# Patient Record
Sex: Male | Born: 2005 | Race: Black or African American | Hispanic: No | Marital: Single | Smoking: Never smoker
Health system: Southern US, Community
[De-identification: ages and names within clinical notes are randomized; demographics above are authoritative.]

## PROBLEM LIST (undated history)

## (undated) DIAGNOSIS — R079 Chest pain, unspecified: Secondary | ICD-10-CM

## (undated) DIAGNOSIS — I4719 Other supraventricular tachycardia: Secondary | ICD-10-CM

## (undated) DIAGNOSIS — I471 Supraventricular tachycardia: Secondary | ICD-10-CM

## (undated) DIAGNOSIS — F909 Attention-deficit hyperactivity disorder, unspecified type: Secondary | ICD-10-CM

## (undated) DIAGNOSIS — R011 Cardiac murmur, unspecified: Secondary | ICD-10-CM

## (undated) HISTORY — PX: CARDIAC SURGERY: SHX584

---
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
## 2023-03-28 IMAGING — CR DG SHOULDER 2+V*R*
2 series · 2 of 2 positions shown · non-contrast
Comparison: None.

CLINICAL DATA: Shoulder pain

EXAM:
RIGHT SHOULDER - 2+ VIEW

[w shoulder grashey right]
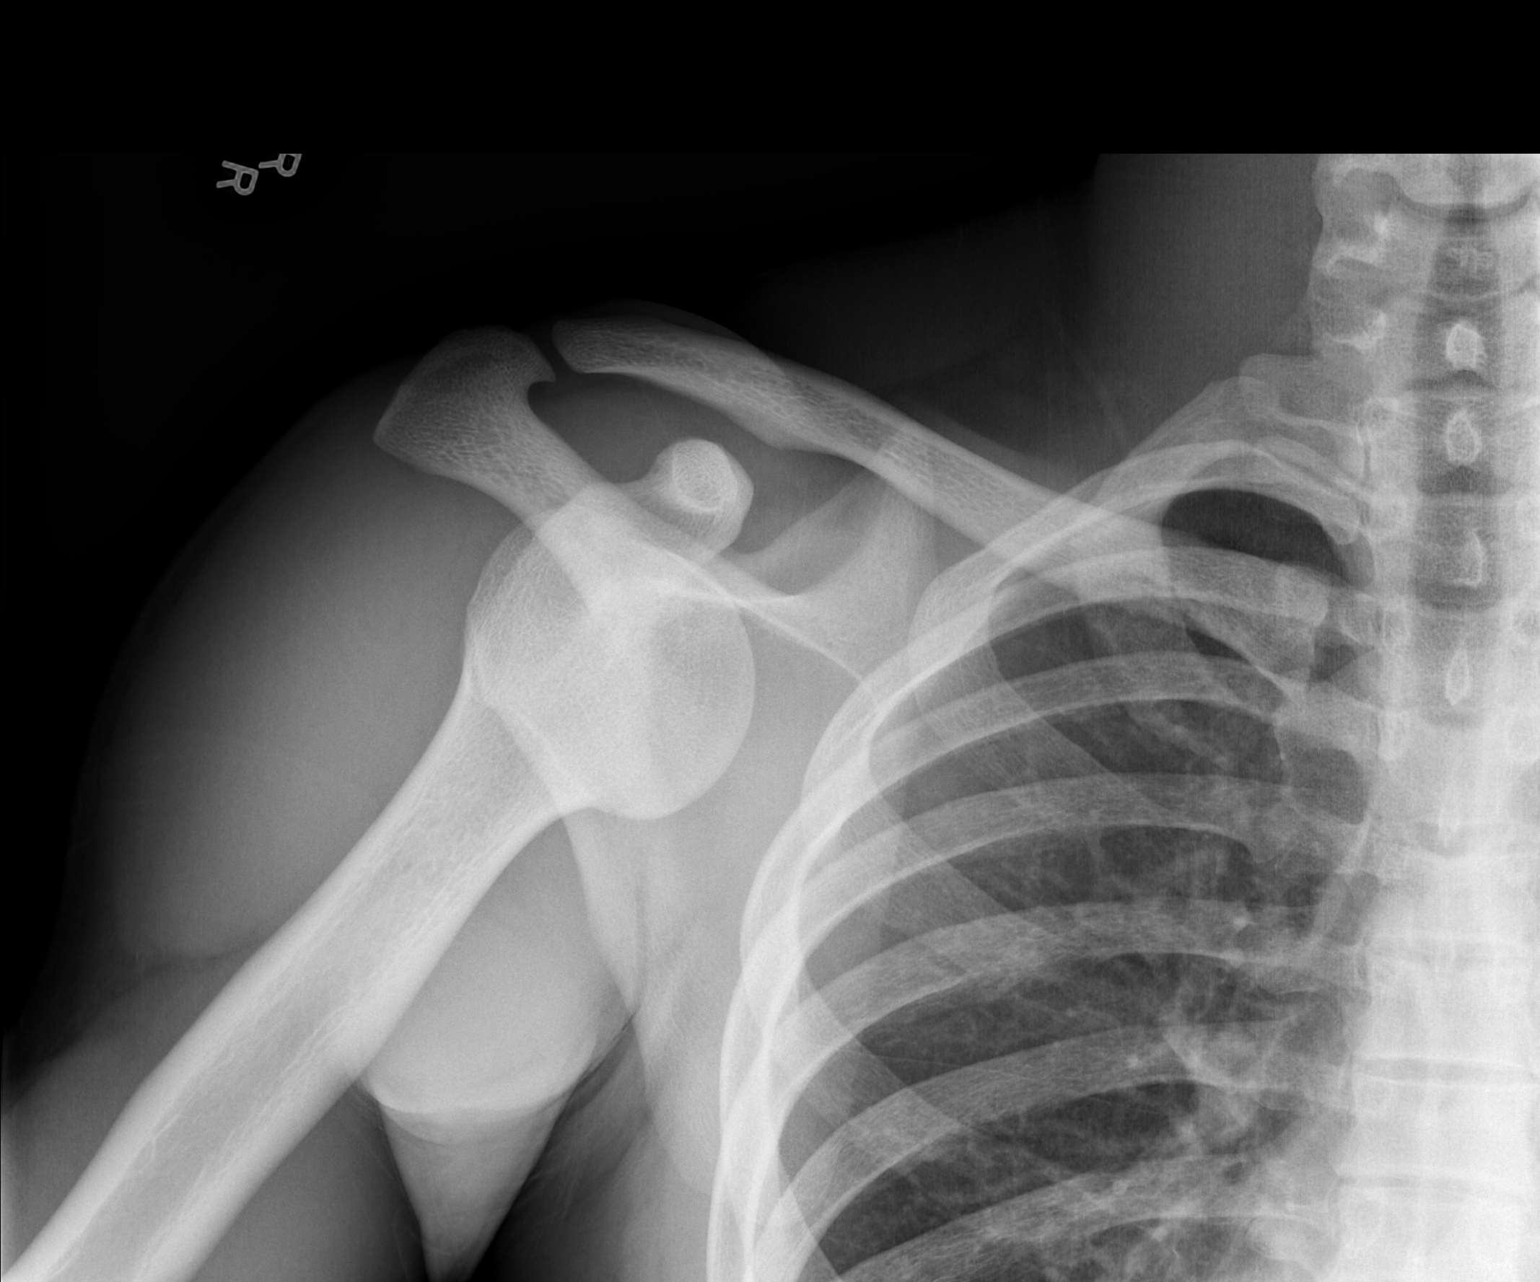

[w shoulder y view right]
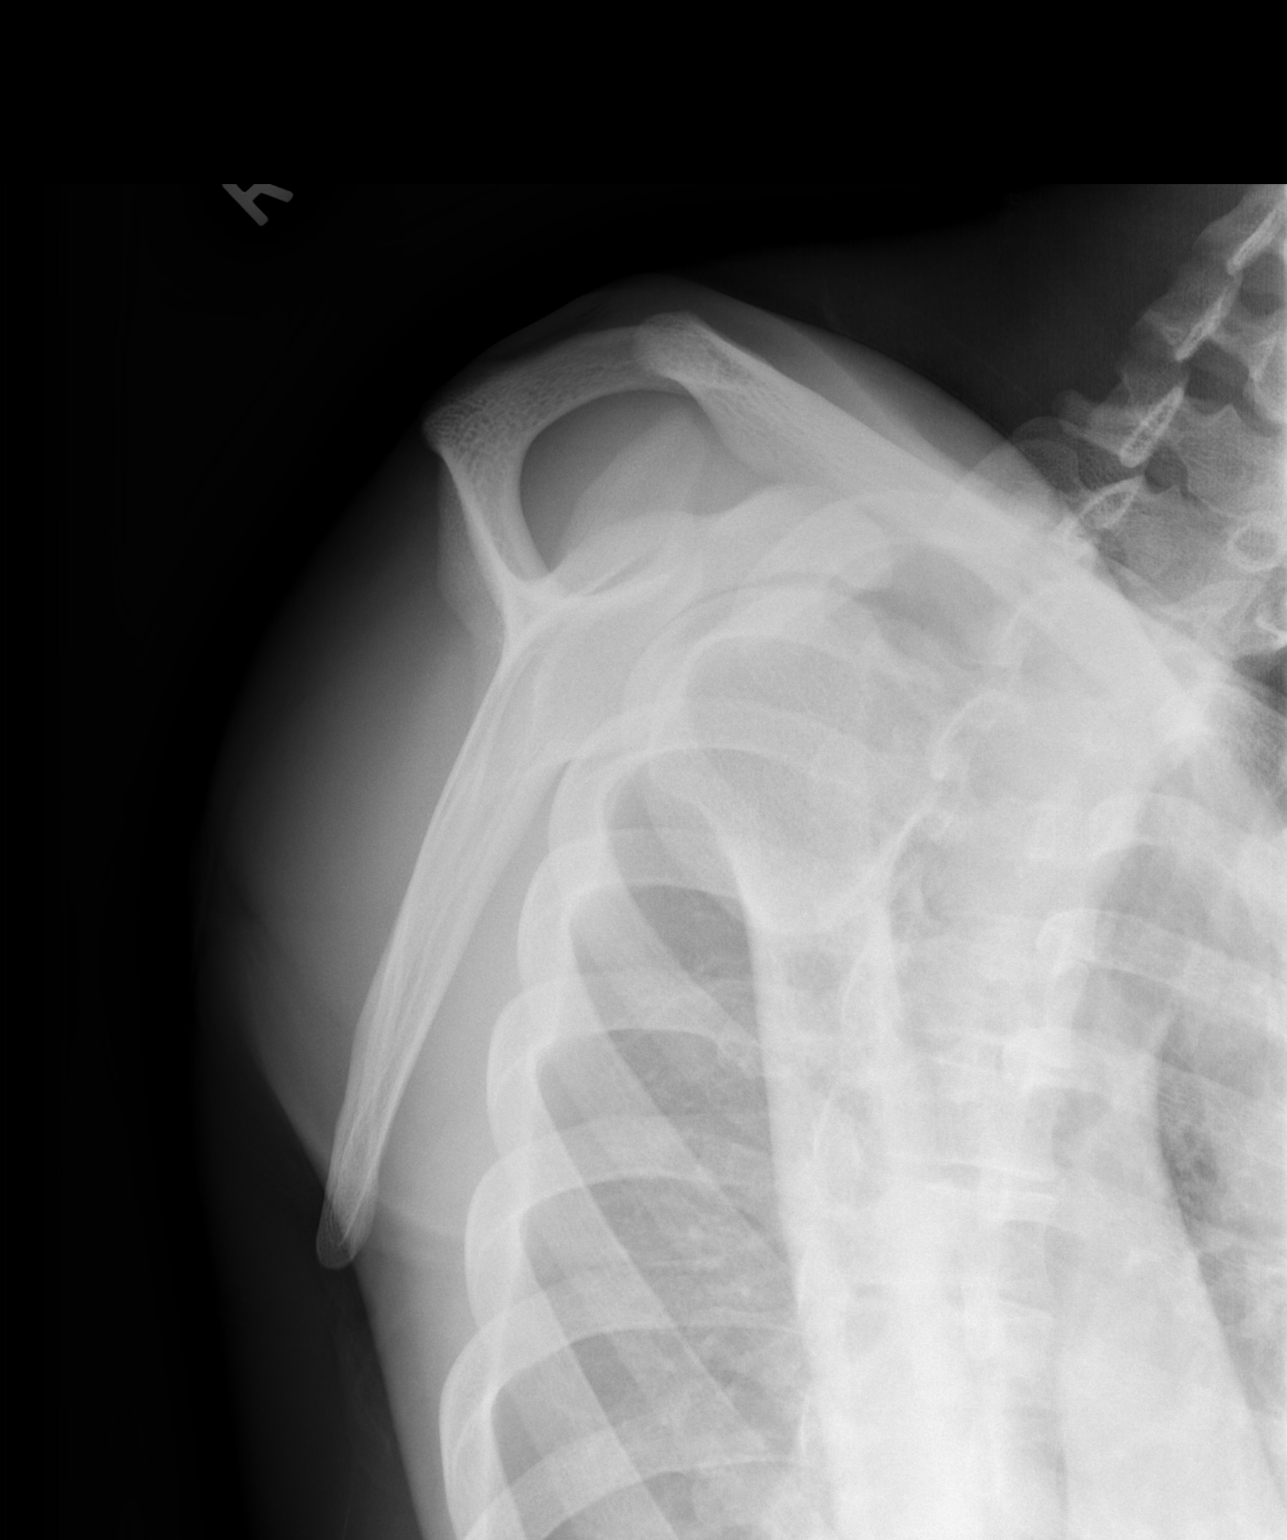

[2 of 2 positions shown; findings below may reference images not displayed]

FINDINGS: Right anterior shoulder dislocation. No evidence of fracture. Soft
tissues are unremarkable.
IMPRESSION: Right anterior shoulder dislocation.  No evidence of fracture.

## 2023-03-28 IMAGING — DX DG SHOULDER 2+V PORT*R*
2 series · 2 of 2 positions shown · non-contrast
Comparison: Radiographs of same day.

CLINICAL DATA: Status post reduction of right shoulder dislocation.

EXAM:
PORTABLE RIGHT SHOULDER

[shoulder ap]
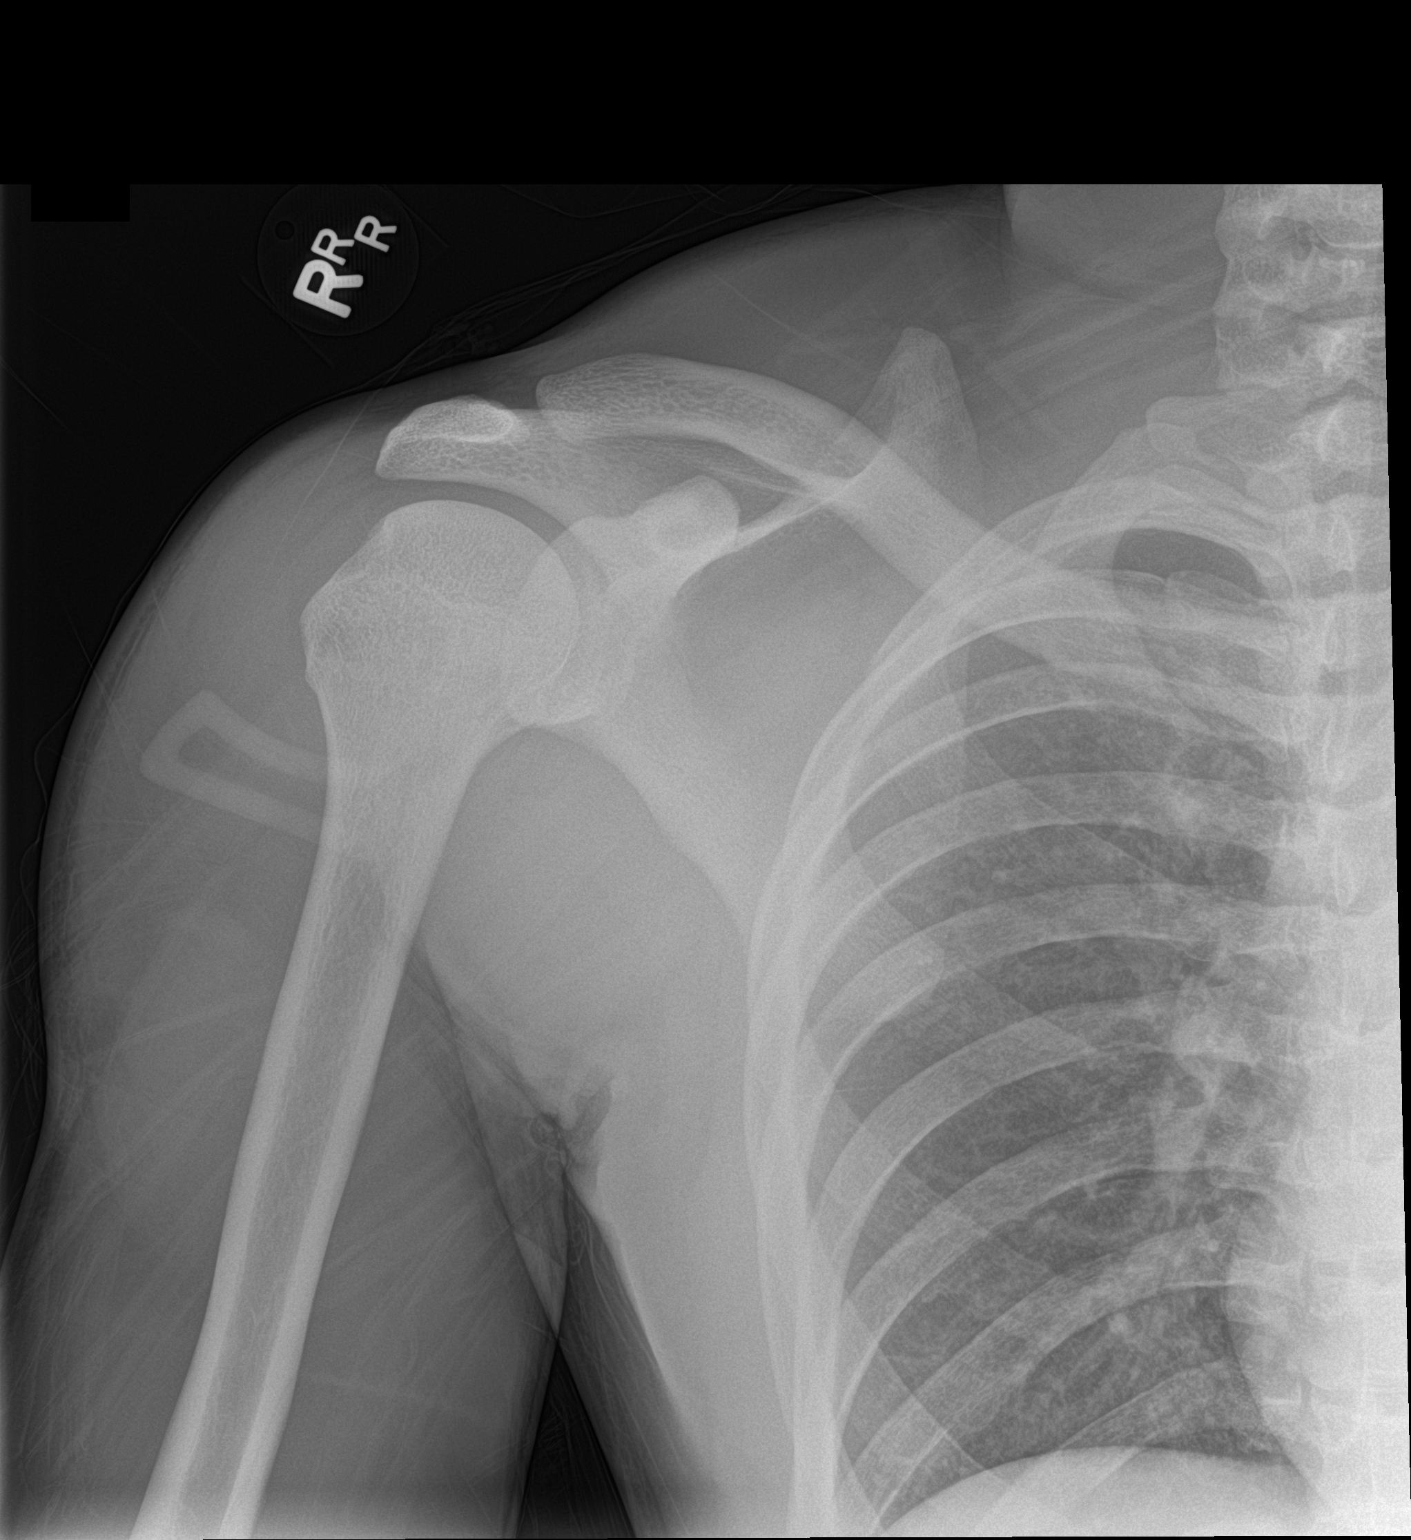

[shoulder obl]
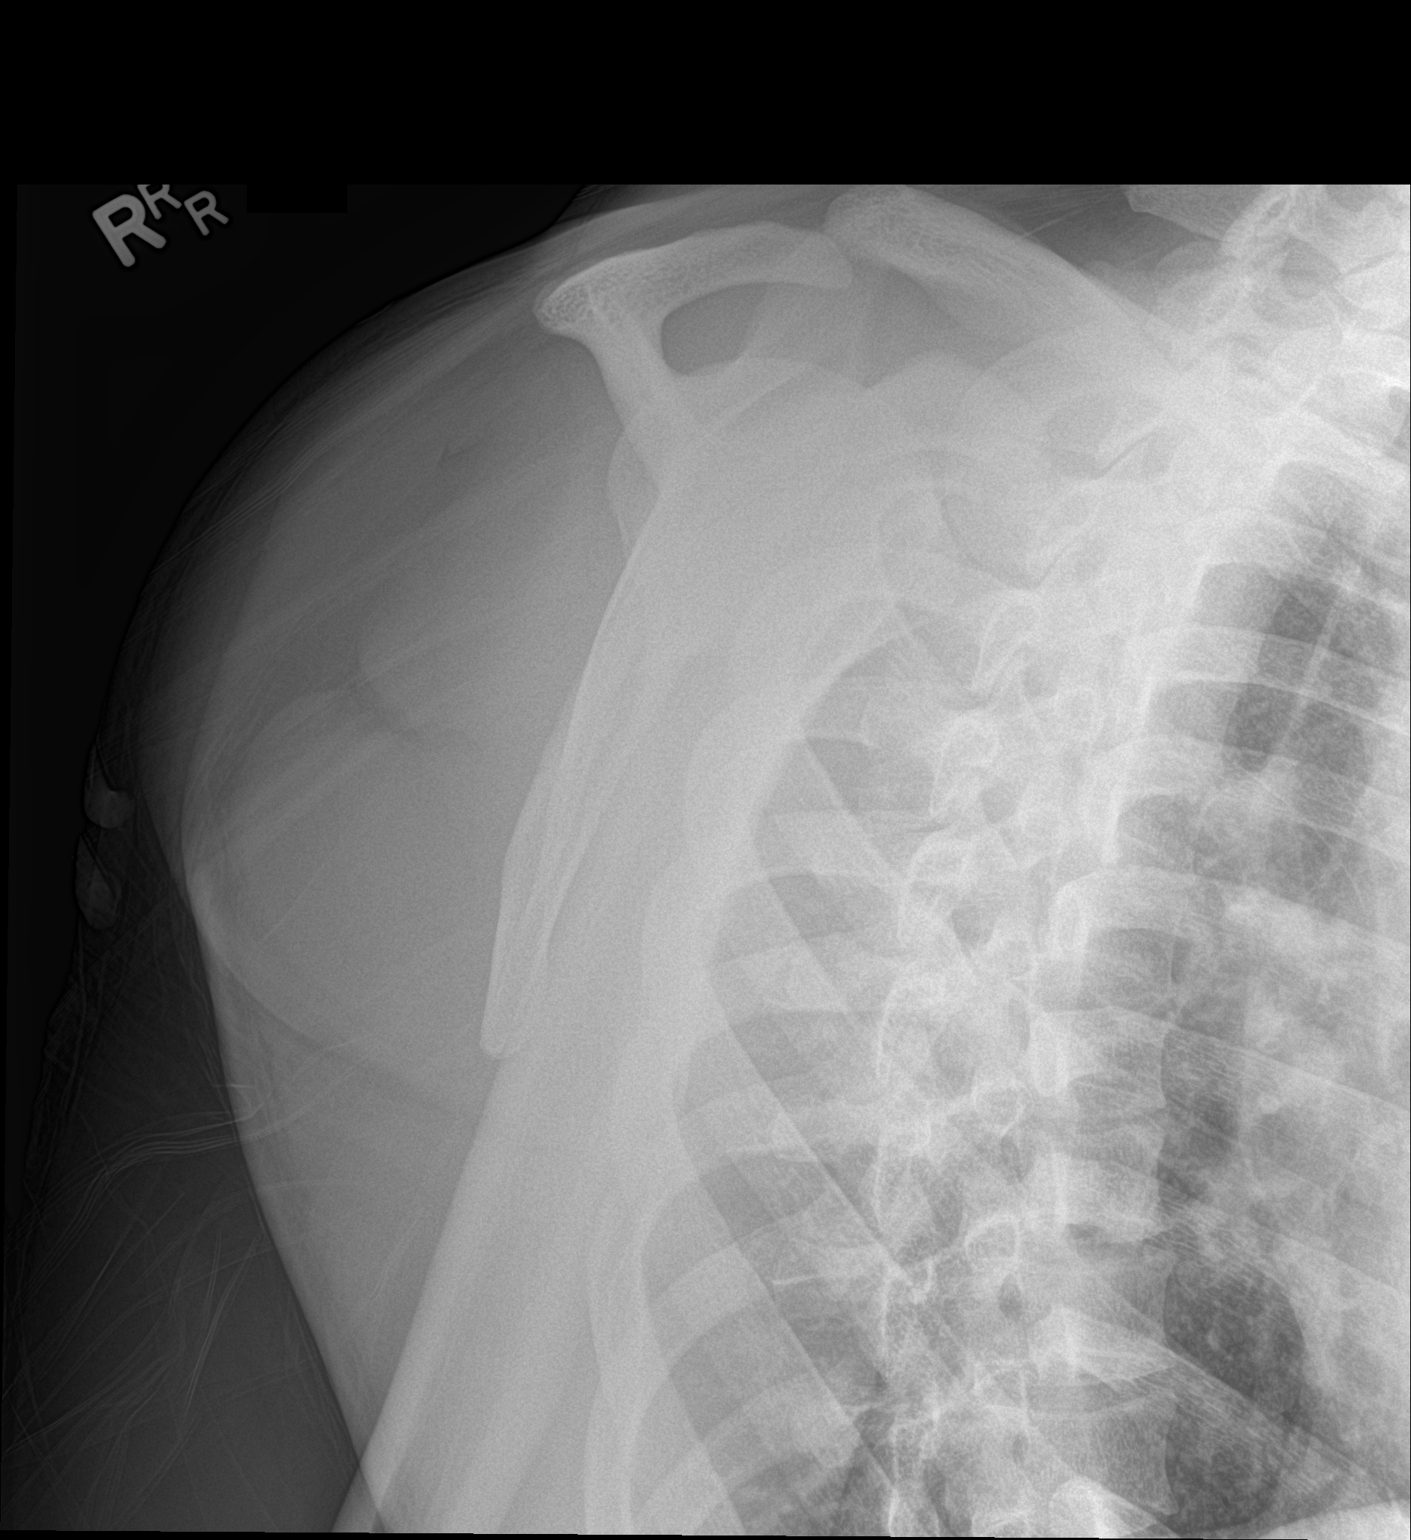

[2 of 2 positions shown; findings below may reference images not displayed]

FINDINGS: There is been successful reduction of previously described anterior
dislocation of right glenohumeral joint. No definite fracture is
noted.
IMPRESSION: Successful reduction of previously described right shoulder
dislocation.
